# Patient Record
Sex: Female | Born: 1950 | Race: Black or African American | Hispanic: No | Smoking: Never smoker
Health system: Southern US, Community
[De-identification: ages and names within clinical notes are randomized; demographics above are authoritative.]

## PROBLEM LIST (undated history)

## (undated) DIAGNOSIS — E785 Hyperlipidemia, unspecified: Secondary | ICD-10-CM

## (undated) DIAGNOSIS — E119 Type 2 diabetes mellitus without complications: Secondary | ICD-10-CM

## (undated) DIAGNOSIS — I10 Essential (primary) hypertension: Secondary | ICD-10-CM

## (undated) HISTORY — PX: TONSILLECTOMY: SUR1361

## (undated) HISTORY — PX: TUBAL LIGATION: SHX77

---

## 2014-07-15 ENCOUNTER — Other Ambulatory Visit: Payer: Self-pay | Admitting: Occupational Medicine

## 2014-07-15 ENCOUNTER — Ambulatory Visit: Payer: Self-pay

## 2014-07-15 DIAGNOSIS — M25522 Pain in left elbow: Secondary | ICD-10-CM

## 2015-05-26 ENCOUNTER — Telehealth (HOSPITAL_COMMUNITY): Payer: Self-pay | Admitting: *Deleted

## 2015-05-26 NOTE — Telephone Encounter (Signed)
Telephoned patient at home # and no answer. 

## 2015-08-10 ENCOUNTER — Telehealth (HOSPITAL_COMMUNITY): Payer: Self-pay | Admitting: *Deleted

## 2015-08-10 NOTE — Telephone Encounter (Signed)
Telephoned patient at home # and no answer. 

## 2015-08-17 ENCOUNTER — Telehealth (HOSPITAL_COMMUNITY): Payer: Self-pay | Admitting: *Deleted

## 2015-08-17 NOTE — Telephone Encounter (Signed)
Telephoned patient at home # and no answer. 

## 2015-11-25 ENCOUNTER — Other Ambulatory Visit: Payer: Self-pay | Admitting: Obstetrics and Gynecology

## 2015-11-25 DIAGNOSIS — Z1231 Encounter for screening mammogram for malignant neoplasm of breast: Secondary | ICD-10-CM

## 2015-12-10 ENCOUNTER — Ambulatory Visit (HOSPITAL_COMMUNITY): Payer: Self-pay

## 2016-05-31 ENCOUNTER — Other Ambulatory Visit: Payer: Self-pay | Admitting: Obstetrics and Gynecology

## 2016-05-31 DIAGNOSIS — Z1231 Encounter for screening mammogram for malignant neoplasm of breast: Secondary | ICD-10-CM

## 2016-06-21 ENCOUNTER — Ambulatory Visit (HOSPITAL_COMMUNITY)
Admission: RE | Admit: 2016-06-21 | Discharge: 2016-06-21 | Disposition: A | Discharge status: Home / Self Care | Payer: Self-pay | Source: Ambulatory Visit | Attending: Obstetrics and Gynecology | Admitting: Obstetrics and Gynecology

## 2016-06-21 ENCOUNTER — Encounter (HOSPITAL_COMMUNITY): Payer: Self-pay

## 2016-06-21 ENCOUNTER — Ambulatory Visit
Admission: RE | Admit: 2016-06-21 | Discharge: 2016-06-21 | Disposition: A | Discharge status: Home / Self Care | Payer: No Typology Code available for payment source | Source: Ambulatory Visit | Attending: Obstetrics and Gynecology | Admitting: Obstetrics and Gynecology

## 2016-06-21 VITALS — BP 140/80 | Ht 60.5 in | Wt 181.6 lb

## 2016-06-21 DIAGNOSIS — Z1239 Encounter for other screening for malignant neoplasm of breast: Secondary | ICD-10-CM

## 2016-06-21 DIAGNOSIS — Z1231 Encounter for screening mammogram for malignant neoplasm of breast: Secondary | ICD-10-CM

## 2016-06-21 HISTORY — DX: Type 2 diabetes mellitus without complications: E11.9

## 2016-06-21 HISTORY — DX: Essential (primary) hypertension: I10

## 2016-06-21 HISTORY — DX: Hyperlipidemia, unspecified: E78.5

## 2016-06-21 NOTE — Patient Instructions (Signed)
Explained breast self awareness with [name]. Patient did not need a Pap smear today due to last Pap smear was in 2017 per patient. Let her know B[facility] will cover Pap smears every 3 years unless has a history of abnormal Pap smears. [facility] for [facility]. Appointment scheduled for Tuesday, Jun 21, 2016 at 1610. Let patient know the Breast Center will follow up with her within the next couple weeks with results of mammogram by letter or phone. Barbara Fields verbalized understanding.  Flavius Repsher, [name], RN 4:01 PM

## 2016-06-21 NOTE — Progress Notes (Signed)
No complaints today.   Pap Smear: Pap smear not completed today. Last Pap smear was in 2017 at the free cervical cancer screening at [facility] and normal per patient. Per patient has a history of an abnormal Pap smear 10 years ago that a colposcopy was completed for follow up. Patient stated she has had at least three normal Pap smears since colposcopy. No Pap smear results are in EPIC.  Physical exam: Breasts Right breast slightly larger than left breast that per patient is normal for her. No skin abnormalities bilateral breasts. No nipple retraction bilateral breasts. No nipple discharge bilateral breasts. No lymphadenopathy. No lumps palpated bilateral breasts. No complaints of pain or tenderness on exam. [facility] for a screening mammogram. Appointment scheduled for Tuesday, Jun 21, 2016 at 1610.        Pelvic/Bimanual No Pap smear completed today since last Pap smear was in 2017 per patient. Pap smear not indicated per BCCCP guidelines.   Smoking History: Patient has never smoked.  Patient Navigation: Patient education provided. Access to services provided for patient through BCCCP program.   Colorectal Cancer Screening: Per patient had a colonoscopy completed 20 years ago. No complaints today. FIT Test given to patient to complete and return to B[facility]CP.

## 2016-06-28 ENCOUNTER — Encounter (HOSPITAL_COMMUNITY): Payer: Self-pay | Admitting: *Deleted

## 2016-07-08 ENCOUNTER — Other Ambulatory Visit: Payer: Self-pay

## 2016-07-08 LAB — FECAL OCCULT BLOOD, GUAIAC: Fecal Occult Blood: NEGATIVE

## 2016-07-11 LAB — FECAL OCCULT BLOOD, IMMUNOCHEMICAL: Fecal Occult Bld: NEGATIVE

## 2016-07-20 ENCOUNTER — Encounter (HOSPITAL_COMMUNITY): Payer: Self-pay | Admitting: *Deleted

## 2016-07-20 NOTE — Progress Notes (Signed)
Letter mailed to patient with negative Fit Test results.  

## 2017-01-31 HISTORY — PX: BREAST BIOPSY: SHX20

## 2017-08-07 ENCOUNTER — Other Ambulatory Visit: Payer: Self-pay | Admitting: Obstetrics and Gynecology

## 2017-08-07 DIAGNOSIS — Z1231 Encounter for screening mammogram for malignant neoplasm of breast: Secondary | ICD-10-CM

## 2017-08-29 ENCOUNTER — Ambulatory Visit
Admission: RE | Admit: 2017-08-29 | Discharge: 2017-08-29 | Disposition: A | Discharge status: Home / Self Care | Payer: No Typology Code available for payment source | Source: Ambulatory Visit | Attending: Obstetrics and Gynecology | Admitting: Obstetrics and Gynecology

## 2017-08-29 ENCOUNTER — Encounter (HOSPITAL_COMMUNITY): Payer: Self-pay

## 2017-08-29 ENCOUNTER — Other Ambulatory Visit (HOSPITAL_COMMUNITY): Payer: Self-pay | Admitting: *Deleted

## 2017-08-29 ENCOUNTER — Ambulatory Visit (HOSPITAL_COMMUNITY)
Admission: RE | Admit: 2017-08-29 | Discharge: 2017-08-29 | Disposition: A | Discharge status: Home / Self Care | Payer: Self-pay | Source: Ambulatory Visit | Attending: Obstetrics and Gynecology | Admitting: Obstetrics and Gynecology

## 2017-08-29 VITALS — BP 132/84 | Ht <= 58 in

## 2017-08-29 DIAGNOSIS — Z1231 Encounter for screening mammogram for malignant neoplasm of breast: Secondary | ICD-10-CM

## 2017-08-29 DIAGNOSIS — Z1239 Encounter for other screening for malignant neoplasm of breast: Secondary | ICD-10-CM

## 2017-08-29 DIAGNOSIS — R2232 Localized swelling, mass and lump, left upper limb: Secondary | ICD-10-CM

## 2017-08-29 DIAGNOSIS — R2231 Localized swelling, mass and lump, right upper limb: Secondary | ICD-10-CM

## 2017-08-29 NOTE — Progress Notes (Signed)
Complaints of left axillary lump x 3 days.  Pap Smear: Pap smear not completed today. Last Pap smear was in 2017 at the free cervical [facility] screening [facility] and normal per patient. Per patient has a history of an abnormal Pap smear 11 years ago that a colposcopy was completed for follow up. Patient stated she has had at least three normal Pap smears since colposcopy. No Pap smear results are in EPIC.  Physical exam: Breasts Breasts symmetrical. No skin abnormalities bilateral breasts. No nipple retraction bilateral breasts. No nipple discharge bilateral breasts. No lymphadenopathy. No lumps palpated left breast. Palpated a lump within the border of the right axilla at 11 o'clock 25 cm from the nipple. No complaints of pain or tenderness on exam. [facility] for a diagnostic mammogram and right breast ultrasound. Appointment scheduled for Tuesday, September 05, 2017 at 1040.        Pelvic/Bimanual No Pap smear completed today since last Pap smear was in 2017 per patient. Pap smear not indicated per BCCCP guidelines.   Smoking History: Patient has never smoked.  Patient Navigation: Patient education provided. Access to services provided for patient through BCCCP program.   Colorectal Cancer Screening: Per patient had a colonoscopy completed around 20 years ago. FIT Test completed 07/08/2016 that was negative. No complaints today. FIT Test given to patient to complete and return to BCCCP.  Breast and Cervical Cancer Risk Assessment: Patient has no family history of breast cancer, known genetic mutations, or radiation treatment to the chest before age 65. Per patient has a history of cervical dysplasia. Patient has no history of being immunocompromised or DES exposure in-utero.  Risk Assessment    Risk Scores      08/29/2017   Last edited by: Lynnell DikeHolland, Sabrina H, LPN   5-year risk: 1.6 %   Lifetime risk: 6.2 %

## 2017-08-29 NOTE — Patient Instructions (Signed)
Explained breast self awareness with Barbara Fields. Patient did not need a Pap smear today due to last Pap smear was in 2017 per patient. Let her know BCCCP will cover Pap smears every 3 years unless has a history of abnormal Pap smears. [facility] for [facility] diagnostic mamm[facility] and right breast ultrasound. Appointment scheduled for Tuesday, September 05, 2017 at 1040. Patient aware of appointment and will be there. Barbara Fields verbalized understanding.  Barbara Fields, Barbara Maserhristine Poll, RN 4:31 PM

## 2017-08-30 ENCOUNTER — Other Ambulatory Visit: Payer: Self-pay | Admitting: Obstetrics and Gynecology

## 2017-09-01 ENCOUNTER — Encounter (HOSPITAL_COMMUNITY): Payer: Self-pay | Admitting: *Deleted

## 2017-09-05 ENCOUNTER — Other Ambulatory Visit (HOSPITAL_COMMUNITY): Payer: Self-pay | Admitting: Obstetrics and Gynecology

## 2017-09-05 ENCOUNTER — Ambulatory Visit
Admission: RE | Admit: 2017-09-05 | Discharge: 2017-09-05 | Disposition: A | Discharge status: Home / Self Care | Payer: No Typology Code available for payment source | Source: Ambulatory Visit | Attending: Obstetrics and Gynecology | Admitting: Obstetrics and Gynecology

## 2017-09-05 DIAGNOSIS — N6310 Unspecified lump in the right breast, unspecified quadrant: Secondary | ICD-10-CM

## 2017-09-05 DIAGNOSIS — R2231 Localized swelling, mass and lump, right upper limb: Secondary | ICD-10-CM

## 2017-09-06 ENCOUNTER — Ambulatory Visit
Admission: RE | Admit: 2017-09-06 | Discharge: 2017-09-06 | Disposition: A | Discharge status: Home / Self Care | Payer: No Typology Code available for payment source | Source: Ambulatory Visit | Attending: Obstetrics and Gynecology | Admitting: Obstetrics and Gynecology

## 2017-09-06 DIAGNOSIS — N6310 Unspecified lump in the right breast, unspecified quadrant: Secondary | ICD-10-CM

## 2017-09-07 LAB — FECAL OCCULT BLOOD, IMMUNOCHEMICAL: Fecal Occult Bld: NEGATIVE

## 2017-09-18 ENCOUNTER — Encounter (HOSPITAL_COMMUNITY): Payer: Self-pay

## 2017-10-03 ENCOUNTER — Ambulatory Visit
Admission: RE | Admit: 2017-10-03 | Discharge: 2017-10-03 | Disposition: A | Discharge status: Home / Self Care | Payer: No Typology Code available for payment source | Source: Ambulatory Visit | Attending: Internal Medicine | Admitting: Internal Medicine

## 2017-10-03 ENCOUNTER — Other Ambulatory Visit: Payer: Self-pay | Admitting: Internal Medicine

## 2017-10-03 DIAGNOSIS — Z111 Encounter for screening for respiratory tuberculosis: Secondary | ICD-10-CM

## 2017-10-09 ENCOUNTER — Other Ambulatory Visit (HOSPITAL_COMMUNITY): Payer: Self-pay | Admitting: *Deleted

## 2017-10-09 DIAGNOSIS — Z0000 Encounter for general adult medical examination without abnormal findings: Secondary | ICD-10-CM

## 2017-10-11 ENCOUNTER — Inpatient Hospital Stay: Payer: Self-pay

## 2017-10-11 ENCOUNTER — Ambulatory Visit: Payer: Self-pay

## 2017-10-11 ENCOUNTER — Inpatient Hospital Stay: Payer: Self-pay | Attending: Obstetrics and Gynecology | Admitting: *Deleted

## 2017-10-11 VITALS — BP 118/70 | Ht 59.0 in | Wt 182.0 lb

## 2017-10-11 DIAGNOSIS — Z0000 Encounter for general adult medical examination without abnormal findings: Secondary | ICD-10-CM | POA: Insufficient documentation

## 2017-10-11 LAB — LIPID PANEL
Cholesterol: 110 mg/dL (ref 0–200)
HDL: 28 mg/dL — ABNORMAL LOW (ref >40)
LDL Cholesterol: 60 mg/dL (ref 0–99)
Total CHOL/HDL Ratio: 3.9 RATIO
Triglycerides: 111 mg/dL (ref <150)
VLDL: 22 mg/dL (ref 0–40)

## 2017-10-11 LAB — HEMOGLOBIN A1C
Hgb A1c MFr Bld: 8.4 % — ABNORMAL HIGH (ref 4.8–5.6)
Mean Plasma Glucose: 194.38 mg/dL

## 2017-10-11 NOTE — Progress Notes (Signed)
[facility] initial screening  Clinical Measurement:  Height: 59in Weight:  182lb Blood Pressure: 120/72  Blood Pressure #2: 118/70   Fasting Labs Drawn Today, will review with patient when they result.  Medical History:  Patient states that she has  been diagnosed with high cholesterol, high blood pressure, diabetes but not  heart disease.  Medications:  Patients states she is taking medications for high cholesterol, high blood pressure and diabetes.  She is  taking aspirin daily to prevent heart attack or stroke.    Blood pressure, self measurement:  Patients states she does not measure blood pressure at home.    Nutrition:  Patient states she eats 1 cup of fruit and 1 cup of vegetables in an average day.  Patient states she does not eat fish regularly, she eats less than half a serving of whole grains daily. She drinks less than 36 ounces of beverages with added sugar weekly.  She is currently not watching her sodium intake.  She has  had 2 drinks containing alcohol in the last seven days.    Physical activity:  Patient states that she gets 90 minutes of moderate exercise in a week.  She gets 0 minutes of vigorous exercise per week.   Smoking status:  Patient states she has never smoked and is not around any smokers.     Quality of life:  Patient states that she has had 0 bad physical days out of the last 30 days. In the last 2 weeks, she has had several days that she has felt down or depressed. She has had a few  days in the last 2 weeks that she has had little interest or pleasure in doing things.  Risk reduction and counseling:  Patient states she wants to lose weight and increase fruit and vegetable intake.  I encouraged her to continue with current exercise regimen and increase vegetable and fruit intake.  Navigation:  I will notify patient of lab results.  Patient is aware of 2 more health coaching sessions and a follow up.

## 2017-10-11 NOTE — Addendum Note (Signed)
Addended by: Deri Fuelling R on: 10/11/2017 09:03 AM   Modules accepted: Orders

## 2017-10-13 ENCOUNTER — Telehealth (HOSPITAL_COMMUNITY): Payer: Self-pay | Admitting: *Deleted

## 2017-10-13 NOTE — Telephone Encounter (Incomplete)
  Health coaching 2  Labs- HDL cholesterol 28, cholesterol  110, triglycerides 111,  LDL cholesterol 60,  Hemoglobin A1C 8.4, mean plasma glucose 194.38   Patient is aware and understands these lab results.   Goals- Patient states she wants to lose weight. I encouraged patient to walk 150 minutes weekly and to chose lean meats,fish (salmon, mackerel and tuna) and to use olive oil and canola oil to cook with. I encouraged patient to snack on nuts such as almonds and walnuts.  Navigation:  Patient is aware of 1 more health coaching sessions and a follow up.     Time- 10 minutes

## 2017-10-13 NOTE — Telephone Encounter (Signed)
Addendum  Patient is under the care of the [facility], at [address],[place], [place] [id number]. She has an appointment in October.

## 2017-11-13 ENCOUNTER — Other Ambulatory Visit: Payer: Self-pay

## 2017-11-17 LAB — CYTOLOGY - PAP: Diagnosis: NEGATIVE

## 2017-12-26 ENCOUNTER — Ambulatory Visit: Payer: Medicare HMO | Attending: Family Medicine | Admitting: Family Medicine

## 2017-12-26 ENCOUNTER — Encounter: Payer: Self-pay | Admitting: Family Medicine

## 2017-12-26 VITALS — BP 138/84 | HR 75 | Resp 16 | Ht 61.0 in | Wt 178.8 lb

## 2017-12-26 DIAGNOSIS — E785 Hyperlipidemia, unspecified: Secondary | ICD-10-CM

## 2017-12-26 DIAGNOSIS — I10 Essential (primary) hypertension: Secondary | ICD-10-CM

## 2017-12-26 DIAGNOSIS — Z1211 Encounter for screening for malignant neoplasm of colon: Secondary | ICD-10-CM

## 2017-12-26 DIAGNOSIS — E1140 Type 2 diabetes mellitus with diabetic neuropathy, unspecified: Secondary | ICD-10-CM

## 2017-12-26 DIAGNOSIS — F419 Anxiety disorder, unspecified: Secondary | ICD-10-CM

## 2017-12-26 DIAGNOSIS — Z79899 Other long term (current) drug therapy: Secondary | ICD-10-CM

## 2017-12-26 DIAGNOSIS — F5104 Psychophysiologic insomnia: Secondary | ICD-10-CM | POA: Diagnosis not present

## 2017-12-26 MED ORDER — LISINOPRIL-HYDROCHLOROTHIAZIDE 20-12.5 MG PO TABS: 1.0000 | ORAL_TABLET | Freq: Every day | ORAL | 4 refills | Status: DC

## 2017-12-26 MED ORDER — METFORMIN HCL ER (OSM) 1000 MG PO TB24: 1000.0000 mg | ORAL_TABLET | Freq: Two times a day (BID) | ORAL | 4 refills | Status: DC

## 2017-12-26 MED ORDER — SIMVASTATIN 40 MG PO TABS: 40.0000 mg | ORAL_TABLET | Freq: Every day | ORAL | 4 refills | Status: AC

## 2017-12-26 MED ORDER — ZOLPIDEM TARTRATE 5 MG PO TABS: 5.0000 mg | ORAL_TABLET | Freq: Every evening | ORAL | 3 refills | Status: AC | PRN

## 2017-12-26 NOTE — Progress Notes (Signed)
Subjective:    Patient ID: Barbara Fields, female    DOB: [birth year], 67 y.o.   MRN: [id number]  HPI       67 year old female new to the practice, patient with past medical history significant for diabetes, hypertension and dyslipidemia.  On review of chart, patient has had blood work done 10/11/2017 with hemoglobin A1c of 8.4, lipid panel with total cholesterol of 110, HDL low at 28 and LDL of 60.      Patient reports that she presents to establish care as she now has Medicare.  Patient reports past medical history is significant for hypertension, diabetes and hyperlipidemia.  Patient believes that her blood sugars are well controlled but she does not check her blood sugars on a frequent basis.  Patient states that she has had some recent home blood sugars that are in the 160s and 140s but prior to that, most of her blood sugars have stayed in the 200s.  Patient reports blood sugar of 149 this morning.  Patient states that she has had A1c and other blood work done within the past few months.  Patient does have occasional issues with increased thirst and urinary frequency.  Patient reports that she had her diabetic eye exam done about 2 weeks ago when she did not have any diabetic retinopathy but did have some changes associated with her hypertension.  Patient reports that she has taken her lisinopril- HCTZ daily.  Patient denies any cough or muscle cramping associated with the use of this medication.  Patient has her medication bottles with her and she is currently on simvastatin 40 mg daily.  Patient has had no increase muscle aches with use of this medication.  Patient admits that she does not always stick to a diabetic or low-sodium diet.      Patient also had pill bottles for zolpidem and Trintellix as well as clonazepam.  Patient states that she would like a refill prescription for zolpidem and she takes anywhere from 1 to 1/2 pills per night to help with sleep.  Patient states that she has  long-standing issues with anxiety and depression and states that she has been getting these medications filled from another provider at [facility] however she does not think that this provider is listed with any of the Medicare programs.  Patient reports that she has to pay $100 every 42-monthto obtain refills of her medication.  Patient does not believe that the Trintellix has made much of a difference in her anxiety or depression.  Patient states that the clonazepam is the only thing that makes her feel calm and feel normal.      Patient reports allergy to vancomycin which caused her to have hives after she received IV vancomycin for treatment of a MRSA infection in the past.  Patient reports a family history of her mother with diabetes and had bilateral lower extremity amputations.  Patient also believes that her mother had a mild stroke.  Patient states that her mother had 13 siblings and many of her mother's siblings have hypertension and diabetes.  Patient has a brother with diabetes.  Patient reports that her father died from heart attack.  Patient has a daughter who died at the age of 341from liver cancer.  Patient reports a past surgical history of a tubal ligation, tonsillectomy around the age of 89 left eye surgery for placement of a metal plate due to a fracture of the orbit secondary to trauma.  Patient also reports  a prior facelift.  Patient has also had a biopsy of the right breast that was benign.  Patient initially stated that she was a non-smoker but upon further questioning, patient quit smoking about 20 years ago but prior to that point had smoked about 1 pack/day of cigarettes since the age of 66.  Patient reports that she has had recent Pap smear and mammogram is up-to-date.  Patient reports that she has not had a colonoscopy in about 10 or more years.  Patient agrees to referral for screening colonoscopy.  Past Medical History:  Diagnosis Date  . Diabetes mellitus without  complication ([place])   . Hyperlipemia   . Hypertension    Past Surgical History:  Procedure Laterality Date  . TONSILLECTOMY    . TUBAL LIGATION     Family History  Problem Relation Age of Onset  . Diabetes Mother   . Hypertension Mother   . Diabetes Sister   . Diabetes Brother   . Breast cancer Cousin    Social History   Tobacco Use  . Smoking status: Never Smoker  . Smokeless tobacco: Never Used  Substance Use Topics  . Alcohol use: Yes    Comment: rarely  . Drug use: No   Allergies  Allergen Reactions  . Peanut Oil Rash  . Shellfish Allergy Hives  . Vancomycin Hives   Current Outpatient Medications on File Prior to Visit  Medication Sig Dispense Refill  . APPLE CIDER VINEGAR PO Take 480 mg by mouth daily.    Marland Kitchen aspirin 81 MG chewable tablet Chew 1 tablet by mouth daily.    . Blood Glucose Monitoring Suppl (ACCU-CHEK AVIVA PLUS) w/Device KIT 1 kit by Other route as directed.    Marland Kitchen CINNAMON PO Take 1,000 mg by mouth daily.    . clonazePAM (KLONOPIN) 0.5 MG tablet Take 1 tablet by mouth as needed.    . fluticasone (FLONASE) 50 MCG/ACT nasal spray Place 1 spray into the nose as needed.    . Multiple Vitamins-Minerals (ONE-A-DAY 50 PLUS PO) Take 1 tablet by mouth daily.    . TRINTELLIX 10 MG TABS tablet Take 1 tablet by mouth daily.    . vitamin B-12 (CYANOCOBALAMIN) 50 MCG tablet Take 1 tablet by mouth as needed.     No current facility-administered medications on file prior to visit.       Review of Systems  Constitutional: Positive for fatigue. Negative for diaphoresis and fever.  HENT: Negative for sore throat and trouble swallowing.   Eyes: Negative for photophobia and visual disturbance.  Respiratory: Negative for cough and shortness of breath.   Cardiovascular: Negative for chest pain, palpitations and leg swelling.  Gastrointestinal: Negative for abdominal pain, constipation, diarrhea and nausea.  Endocrine: Positive for polydipsia, polyphagia and polyuria.         Sometimes has increased urination, increased hunger and sensation of dry mouth when blood sugars are elevated  Genitourinary: Negative for dysuria and flank pain.       Occasional urinary frequency  Musculoskeletal: Negative for back pain and gait problem.  Neurological: Positive for numbness (Has occasional numbness in her hands and feet). Negative for dizziness and headaches.  Hematological: Negative for adenopathy. Does not bruise/bleed easily.  Psychiatric/Behavioral: Positive for sleep disturbance. Negative for self-injury and suicidal ideas. The patient is nervous/anxious.        Objective:   Physical Exam BP 138/84 (BP Location: Left Arm, Patient Position: Sitting, Cuff Size: Normal)   Pulse 75   Resp 16  Ht _0  (1.549 m)   Wt 178 lb 12.8 oz (81.1 kg)   BMI 33.78 kg/m Nurse's notes and vital signs reviewed General-well-nourished, well-developed overweight for height older female in no acute distress ENT-TMs gray and slightly dull, patient with moderate edema of the nasal turbinates with mild clear discharge, patient with mild posterior pharynx/tonsillar arch erythema Neck-supple, no lymphadenopathy, no thyromegaly, no carotid bruit.  Patient does have some mild, nontender, submandibular lymphadenopathy Cardiovascular-regular rate and rhythm Lungs-clear to auscultation bilaterally Abdomen-truncal obesity, soft and nontender Back-no CVA tenderness Extremities-no edema Diabetic foot exam- patient with negative sensation on the left heel on monofilament exam and patient with thickened skin/callus on the left heel.  Patient also with a callus on the right medial great toe with absence of sensation over the area of the callus but otherwise normal monofilament exam on the right.  Patient's peripheral pulses, dorsalis pedis and posterior tibial are nonpalpable.  Patient's feet are slightly cool bilaterally but no evidence of cyanosis.  Patient with adequate capillary refill  time. Psychiatric- normal mood and judgment, patient became slightly agitated/verbally aggressive when she was told that her Ambien dose will be decreased to 5 mg due to her age with decreased amount per month as well as the need for her clonazepam to be filled by her prior provider and/or referral to a psychiatric specialist.  Patient stated that she may change to a different provider or return to her prior provider        Assessment & Plan:  1. Type 2 diabetes mellitus with diabetic neuropathy, without long-term current use of insulin ([place]) Review of chart, patient's last hemoglobin A1c was 8.4 on 10/11/2017.  I discussed with the patient that this is considered to be high and her goal A1c is 7.0 or less.  Patient was encouraged to monitor her blood sugars more frequently, make dietary changes and continue adherence to use of metformin.  Patient will have CMP and urine, microalbumin at today's visit as well as vitamin B12 level as metformin can decrease vitamin B12 and patient with complaint of some numbness and tingling in her hands and feet and abnormal monofilament exam.  Patient reports that she has had diabetic eye exam approximately 2 weeks ago. - Comprehensive metabolic panel - Microalbumin/Creatinine Ratio, Urine - Vitamin B12  2. Essential hypertension Patient had pill bottle with her for lisinopril-HCTZ at 20-12.5 mg which was refilled at today's visit.  Patient's blood pressure is adequately controlled.  Blood pressure goal of 130/80 discussed as well as low-salt diet.  Patient will also have microalbumin/creatinine ratio at today's visit. - Microalbumin/Creatinine Ratio, Urine  3. Dyslipidemia Patient with a history of dyslipidemia and patient did have lipid panel done on 10/11/2017 with total cholesterol at 110, triglycerides of 111, HDL of 28 and LDL of 60.  Patient will continue use of simvastatin 40 mg but this will likely be lowered if her next lipid panel continues to remain  within normal limits.  Patient will have CMP in follow-up of medication use. - Comprehensive metabolic panel  4. Chronic insomnia Patient with complaint of anxiety/depression and chronic insomnia.  Patient was not pleased that her dose of Ambien would be decreased from her current 10 mg down to 5 mg.  Discussed with patient that due to her age, 5 mg was maximum dose and that Ambien is not recommended in geriatric populations due to increased fall risk, mental impairment and possible increased risk for future development of dementia.  Patient was provided  with prescription for Ambien 5 mg #15.  Patient is being referred to psychiatry for follow-up of her issues with anxiety/depression and chronic insomnia. - Ambulatory referral to Psychiatry - zolpidem (AMBIEN) 5 MG tablet; Take 1 tablet (5 mg total) by mouth at bedtime as needed for sleep.  Dispense: 15 tablet; Refill: 3  5. Anxiety Patient has current bottles for Trintellix and clonazepam which she states that she received from [facility].  Patient may continue follow-up with [facility] though she complains of the additional cost of $100 every 3 months but will also be referred to psychiatry for further evaluation and treatment as patient states that the Trintellix does not really seem to work. - Ambulatory referral to Psychiatry  6. Encounter for long-term (current) use of medications Patient will have CMP and urine microalbumin in follow-up of long-term use of medications. - Comprehensive metabolic panel - Microalbumin/Creatinine Ratio, Urine - Vitamin B12  7.  Screening for colon cancer Patient agrees to referral for screening colonoscopy  Return in about 4 months (around 04/26/2018) for Diabetes.   An After Visit Summary was printed and given to the patient.

## 2017-12-26 NOTE — Patient Instructions (Signed)
Blood Glucose Monitoring, Adult Monitoring your blood sugar (glucose) helps you manage your diabetes. It also helps you and your health care provider determine how well your diabetes management plan is working. Blood glucose monitoring involves checking your blood glucose as often as directed, and keeping a record (log) of your results over time. Why should I monitor my blood glucose? Checking your blood glucose regularly can:  Help you understand how food, exercise, illnesses, and medicines affect your blood glucose.  Let you know what your blood glucose is at any time. You can quickly tell if you are having low blood glucose (hypoglycemia) or high blood glucose (hyperglycemia).  Help you and your health care provider adjust your medicines as needed.  When should I check my blood glucose? Follow instructions from your health care provider about how often to check your blood glucose. This may depend on:  The type of diabetes you have.  How well-controlled your diabetes is.  Medicines you are taking.  If you have type 1 diabetes:  Check your blood glucose at least 2 times a day.  Also check your blood glucose: ? Before every insulin injection. ? Before and after exercise. ? Between meals. ? 2 hours after a meal. ? Occasionally between 2:00 a.m. and 3:00 a.m., as directed. ? Before potentially dangerous tasks, like driving or using heavy machinery. ? At bedtime.  You may need to check your blood glucose more often, up to 6-10 times a day: ? If you use an insulin pump. ? If you need multiple daily injections (MDI). ? If your diabetes is not well-controlled. ? If you are ill. ? If you have a history of severe hypoglycemia. ? If you have a history of not knowing when your blood glucose is getting low (hypoglycemia unawareness). If you have type 2 diabetes:  If you take insulin or other diabetes medicines, check your blood glucose at least 2 times a day.  If you are on intensive  insulin therapy, check your blood glucose at least 4 times a day. Occasionally, you may also need to check between 2:00 a.m. and 3:00 a.m., as directed.  Also check your blood glucose: ? Before and after exercise. ? Before potentially dangerous tasks, like driving or using heavy machinery.  You may need to check your blood glucose more often if: ? Your medicine is being adjusted. ? Your diabetes is not well-controlled. ? You are ill. What is a blood glucose log?  A blood glucose log is a record of your blood glucose readings. It helps you and your health care provider: ? Look for patterns in your blood glucose over time. ? Adjust your diabetes management plan as needed.  Every time you check your blood glucose, write down your result and notes about things that may be affecting your blood glucose, such as your diet and exercise for the day.  Most glucose meters store a record of glucose readings in the meter. Some meters allow you to download your records to a computer. How do I check my blood glucose? Follow these steps to get accurate readings of your blood glucose: Supplies needed   Blood glucose meter.  Test strips for your meter. Each meter has its own strips. You must use the strips that come with your meter.  A needle to prick your finger (lancet). Do not use lancets more than once.  A device that holds the lancet (lancing device).  A journal or log book to write down your results. Procedure  Wash your hands with soap and water.  Prick the side of your finger (not the tip) with the lancet. Use a different finger each time.  Gently rub the finger until a small drop of blood appears.  Follow instructions that come with your meter for inserting the test strip, applying blood to the strip, and using your blood glucose meter.  Write down your result and any notes. Alternative testing sites  Some meters allow you to use areas of your body other than your finger  (alternative sites) to test your blood.  If you think you may have hypoglycemia, or if you have hypoglycemia unawareness, do not use alternative sites. Use your finger instead.  Alternative sites may not be as accurate as the fingers, because blood flow is slower in these areas. This means that the result you get may be delayed, and it may be different from the result that you would get from your finger.  The most common alternative sites are: ? Forearm. ? Thigh. ? Palm of the hand. Additional tips  Always keep your supplies with you.  If you have questions or need help, all blood glucose meters have a 24-hour "hotline" number that you can call. You may also contact your health care provider.  After you use a few boxes of test strips, adjust (calibrate) your blood glucose meter by following instructions that came with your meter. This information is not intended to replace advice given to you by your health care provider. Make sure you discuss any questions you have with your health care provider. Document Released: 01/20/2003 Document Revised: 08/07/2015 Document Reviewed: 06/29/2015 Elsevier Interactive Patient Education  2017 Elsevier Inc. Insomnia Insomnia is a sleep disorder that makes it difficult to fall asleep or to stay asleep. Insomnia can cause tiredness (fatigue), low energy, difficulty concentrating, mood swings, and poor performance at work or school. There are three different ways to classify insomnia:  Difficulty falling asleep.  Difficulty staying asleep.  Waking up too early in the morning.  Any type of insomnia can be long-term (chronic) or short-term (acute). Both are common. Short-term insomnia usually lasts for three months or less. Chronic insomnia occurs at least three times a week for longer than three months. What are the causes? Insomnia may be caused by another condition, situation, or substance, such as:  Anxiety.  Certain  medicines.  Gastroesophageal reflux disease (GERD) or other gastrointestinal conditions.  Asthma or other breathing conditions.  Restless legs syndrome, sleep apnea, or other sleep disorders.  Chronic pain.  Menopause. This may include hot flashes.  Stroke.  Abuse of alcohol, tobacco, or illegal drugs.  Depression.  Caffeine.  Neurological disorders, such as Alzheimer disease.  An overactive thyroid (hyperthyroidism).  The cause of insomnia may not be known. What increases the risk? Risk factors for insomnia include:  Gender. Women are more commonly affected than men.  Age. Insomnia is more common as you get older.  Stress. This may involve your professional or personal life.  Income. Insomnia is more common in people with lower income.  Lack of exercise.  Irregular work schedule or night shifts.  Traveling between different time zones.  What are the signs or symptoms? If you have insomnia, trouble falling asleep or trouble staying asleep is the main symptom. This may lead to other symptoms, such as:  Feeling fatigued.  Feeling nervous about going to sleep.  Not feeling rested in the morning.  Having trouble concentrating.  Feeling irritable, anxious, or depressed.  How  is this treated? Treatment for insomnia depends on the cause. If your insomnia is caused by an underlying condition, treatment will focus on addressing the condition. Treatment may also include:  Medicines to help you sleep.  Counseling or therapy.  Lifestyle adjustments.  Follow these instructions at home:  Take medicines only as directed by your health care provider.  Keep regular sleeping and waking hours. Avoid naps.  Keep a sleep diary to help you and your health care provider figure out what could be causing your insomnia. Include: ? When you sleep. ? When you wake up during the night. ? How well you sleep. ? How rested you feel the next day. ? Any side effects of  medicines you are taking. ? What you eat and drink.  Make your bedroom a comfortable place where it is easy to fall asleep: ? Put up shades or special blackout curtains to block light from outside. ? Use a white noise machine to block noise. ? Keep the temperature cool.  Exercise regularly as directed by your health care provider. Avoid exercising right before bedtime.  Use relaxation techniques to manage stress. Ask your health care provider to suggest some techniques that may work well for you. These may include: ? Breathing exercises. ? Routines to release muscle tension. ? Visualizing peaceful scenes.  Cut back on alcohol, caffeinated beverages, and cigarettes, especially close to bedtime. These can disrupt your sleep.  Do not overeat or eat spicy foods right before bedtime. This can lead to digestive discomfort that can make it hard for you to sleep.  Limit screen use before bedtime. This includes: ? Watching TV. ? Using your smartphone, tablet, and computer.  Stick to a routine. This can help you fall asleep faster. Try to do a quiet activity, brush your teeth, and go to bed at the same time each night.  Get out of bed if you are still awake after 15 minutes of trying to sleep. Keep the lights down, but try reading or doing a quiet activity. When you feel sleepy, go back to bed.  Make sure that you drive carefully. Avoid driving if you feel very sleepy.  Keep all follow-up appointments as directed by your health care provider. This is important. Contact a health care provider if:  You are tired throughout the day or have trouble in your daily routine due to sleepiness.  You continue to have sleep problems or your sleep problems get worse. Get help right away if:  You have serious thoughts about hurting yourself or someone else. This information is not intended to replace advice given to you by your health care provider. Make sure you discuss any questions you have with  your health care provider. Document Released: 01/15/2000 Document Revised: 06/19/2015 Document Reviewed: 10/18/2013 Elsevier Interactive Patient Education  [employer]2018 Elsevier Inc.

## 2017-12-27 LAB — COMPREHENSIVE METABOLIC PANEL WITH GFR
ALT: 55 IU/L — ABNORMAL HIGH (ref 0–32)
AST: 34 IU/L (ref 0–40)
Albumin/Globulin Ratio: 1.7 (ref 1.2–2.2)
Albumin: 4.9 g/dL — ABNORMAL HIGH (ref 3.6–4.8)
Alkaline Phosphatase: 79 IU/L (ref 39–117)
BUN/Creatinine Ratio: 13 (ref 12–28)
BUN: 13 mg/dL (ref 8–27)
Bilirubin Total: 0.3 mg/dL (ref 0.0–1.2)
CO2: 22 mmol/L (ref 20–29)
Calcium: 10.7 mg/dL — ABNORMAL HIGH (ref 8.7–10.3)
Chloride: 99 mmol/L (ref 96–106)
Creatinine, Ser: 1.03 mg/dL — ABNORMAL HIGH (ref 0.57–1.00)
GFR calc Af Amer: 66 mL/min/1.73
GFR calc non Af Amer: 57 mL/min/1.73 — ABNORMAL LOW
Globulin, Total: 2.9 g/dL (ref 1.5–4.5)
Glucose: 121 mg/dL — ABNORMAL HIGH (ref 65–99)
Potassium: 4.9 mmol/L (ref 3.5–5.2)
Sodium: 139 mmol/L (ref 134–144)
Total Protein: 7.8 g/dL (ref 6.0–8.5)

## 2017-12-27 LAB — MICROALBUMIN / CREATININE URINE RATIO
Creatinine, Urine: 63.8 mg/dL
Microalb/Creat Ratio: 9.1 mg/g{creat} (ref 0.0–30.0)
Microalbumin, Urine: 5.8 ug/mL

## 2017-12-27 LAB — VITAMIN B12: Vitamin B-12: 593 pg/mL (ref 232–1245)

## 2018-01-01 ENCOUNTER — Telehealth: Payer: Self-pay

## 2018-01-01 NOTE — Telephone Encounter (Signed)
Call is regarding Barbara Fields's 3rd health coaching session with [employer].

## 2018-01-08 ENCOUNTER — Telehealth: Payer: Self-pay | Admitting: *Deleted

## 2018-01-08 NOTE — Telephone Encounter (Signed)
-----   Message from Cain Saupeammie Fulp, MD sent at 12/27/2017  8:44 AM EST ----- Urine microalbumin level is pending. Patient with glucose of 121 and a mild increase in Cr at 1.03 (normal 0.57-1.00) and a mild increase in ALT at 55 (normal 0-32). Will repeat lab at patient's next visit. Vitamin B12 level is normal

## 2018-01-08 NOTE — Telephone Encounter (Signed)
Left message on voicemail to return call.

## 2018-01-10 ENCOUNTER — Telehealth: Payer: Self-pay | Admitting: Family Medicine

## 2018-01-10 NOTE — Telephone Encounter (Signed)
Dr. Jillyn HiddenFulp, does this patient need Fortamet or can we re-send for Metformin Er 500mg  2 tabs BID?

## 2018-01-10 NOTE — Telephone Encounter (Signed)
Please contact patient and see if she is agreeable to the metformin ER

## 2018-01-10 NOTE — Telephone Encounter (Signed)
Pt called [facility] [phone] stating they do not cover METFORMIN. Pt request we contact [employer] to get it approved.

## 2018-01-11 NOTE — Telephone Encounter (Signed)
Left VM asking for call back to discuss switch to Metformin ER 500mg  (2tablets twice daily)

## 2018-01-12 ENCOUNTER — Telehealth (INDEPENDENT_AMBULATORY_CARE_PROVIDER_SITE_OTHER): Payer: Self-pay

## 2018-01-12 NOTE — Telephone Encounter (Signed)
Ms. Barbara Fields is ok with a change to the metformin that is covered by her insurance. She also asked for clarification on the vm that was left for her about her labs, I re-read the results to her and let her know what the tests were for, she felt she understood what she was questioning in the vm.   The patient also mentioned she has been paying out of pocket for her zolpidem, I will create a PA for this and let her know of the outcome. I let her know it is common for medicare to require this on any med that can increase fall risks. She has my number to call back with further questions or concerns.

## 2018-01-12 NOTE — Telephone Encounter (Signed)
-----   Message from Cammie Fulp, MD sent at 12/27/2017  8:44 AM EST ----- Urine microalbumin level is pending. Patient with glucose of 121 and a mild increase in Cr at 1.03 (normal 0.57-1.00) and a mild increase in ALT at 55 (normal 0-32). Will repeat lab at patient's next visit. Vitamin B12 level is normal 

## 2018-01-12 NOTE — Telephone Encounter (Signed)
Left voicemail notifying patient of results per Dr. Jillyn HiddenFulp. Maryjean Mornempestt S Roberts, CMA

## 2018-01-15 ENCOUNTER — Other Ambulatory Visit: Payer: Self-pay | Admitting: Family Medicine

## 2018-01-15 DIAGNOSIS — E1140 Type 2 diabetes mellitus with diabetic neuropathy, unspecified: Secondary | ICD-10-CM

## 2018-01-15 MED ORDER — METFORMIN HCL ER 500 MG PO TB24: ORAL_TABLET | ORAL | 4 refills | Status: DC

## 2018-01-15 NOTE — Progress Notes (Signed)
Patient ID: Barbara Fields, female   DOB: 11-Jun-1950, 67 y.o.   MRN: [id number]   See recent phone encounter.  Patient called regarding medications.  Patient is agreeable to having metformin 500 mg ER, 2 pills twice daily called in as similar medication, Fortamet was not available.  Medication will be sent into patient's pharmacy.

## 2018-01-17 NOTE — Telephone Encounter (Signed)
Patient does not require a new RX for Zolpidem, just the new Metformin RX. She just happened to mention the need for a PA on her existing prescription.

## 2018-01-17 NOTE — Telephone Encounter (Signed)
Does a new RX needed to be printed or prescribed electronically for her Ambien 5 mg #15 per month? If sent electronically then to which pharmacy? Patient can receive up to 5 refills

## 2018-01-17 NOTE — Telephone Encounter (Signed)
I sent in a RX yesterday for the metformin ER

## 2018-01-17 NOTE — Telephone Encounter (Signed)
The PA for the patients Zolpidem 5mg  was approved through 01/31/2019.

## 2018-02-02 ENCOUNTER — Telehealth: Payer: Self-pay | Admitting: Family Medicine

## 2018-02-02 NOTE — Telephone Encounter (Signed)
Pt called to request an update on her psychology referral. Please follow up

## 2018-02-05 ENCOUNTER — Telehealth: Payer: Self-pay | Admitting: Family Medicine

## 2018-02-05 NOTE — Telephone Encounter (Signed)
Pt called to request an update on a referral to see a physiatrist on 12/26/2017, no referral was found. Please follow up

## 2018-02-06 ENCOUNTER — Other Ambulatory Visit: Payer: Self-pay

## 2018-02-06 DIAGNOSIS — R0989 Other specified symptoms and signs involving the circulatory and respiratory systems: Secondary | ICD-10-CM

## 2018-02-06 DIAGNOSIS — I739 Peripheral vascular disease, unspecified: Secondary | ICD-10-CM

## 2018-02-06 NOTE — Telephone Encounter (Signed)
I'm unable to  See the Behavior Referral it goes internal . I lvm to patient with Behavior health #

## 2018-02-08 ENCOUNTER — Other Ambulatory Visit: Payer: Self-pay | Admitting: Family Medicine

## 2018-02-08 NOTE — Telephone Encounter (Signed)
1) Medication(s) Requested (by name): Zolpidem  *pharmacy says they dont have an extra refill for the medication and need the prescription resent.  2) Pharmacy of Choice:  [phone] [place], [place] - [address]

## 2018-02-08 NOTE — Telephone Encounter (Signed)
Pt called to request an update on a referral to see a phychiatrist please follow up.  She was also interested in getting family counseling and is concerned if her insurance would cover it and if a referral was needed and if any resources could be provided. Please follow up.

## 2018-02-12 NOTE — Telephone Encounter (Signed)
Patient verified DOB Patient did receive the information for [facility] psychiatry referral.  Patient is also aware of ambien having 3 refills from 12/26/17. MA will contact pharmacy to clarify. No further questions. MA spoke with pharmacy and patient has medication to last until 03/05/2018. Patient filled an old august prescription, picked up 12/26/17 script and had another script written on 01/29/18. Patient will need to follow up with [facility] provider for additional refills.

## 2018-02-12 NOTE — Telephone Encounter (Signed)
1) Medication(s) Requested (by name): °Zolpidem ° °*pharmacy says they dont have an extra refill for the medication and need the prescription resent.  °2) Pharmacy of Choice: ° °[id number] [place], [place] - [address] °

## 2018-02-12 NOTE — Telephone Encounter (Signed)
Called and left vm for this patient.  She filled the prescription for Zolpidem 5mg  #15/15d from Dr. Jillyn Hidden on 12/26/17 then filled Zolpidem 10mg  #30/30 written by Dr. Concepcion Elk on 01/04/18   She picked up and RX for Zolpidem 10mg  #30/30 written by Dr. Concepcion Elk on 01/29/18, she should have enough medication to last until 03/05/18

## 2018-02-13 ENCOUNTER — Telehealth: Payer: Self-pay

## 2018-02-13 NOTE — Telephone Encounter (Signed)
Incoming call was transferred to me from [place] at the front desk.  Ms. Barbara Fields was upset and confused about her refills on her zolpidem, despite having spoken with [place] yesterday. I explained to her that since she had refilled the zolpidem that was written by another provider for a higher dose it invalidated the RX written by Dr. Jillyn Hidden. The patient then said that she understood completely now and she "had no idea this would be such a problem." I explained to her Dr. Debroah Baller reasoning for lowering her dose and having her use only prn due to her age and the [facility] [place] Criteria. She states she has been on Barbara Fields for many years and she is fine, she also reported that sometimes she has to take one and a half tablets(15mg ) to sleep. I told her that dosing is not recommended and she stated that her other doctors didn't care and she will be looking for a new PCP.

## 2018-02-14 ENCOUNTER — Ambulatory Visit: Payer: Medicare HMO | Attending: Family Medicine | Admitting: Physician Assistant

## 2018-02-14 VITALS — BP 172/81 | HR 63 | Temp 97.9°F | Resp 16 | Ht <= 58 in | Wt 179.6 lb

## 2018-02-14 DIAGNOSIS — E1140 Type 2 diabetes mellitus with diabetic neuropathy, unspecified: Secondary | ICD-10-CM

## 2018-02-14 DIAGNOSIS — H65191 Other acute nonsuppurative otitis media, right ear: Secondary | ICD-10-CM | POA: Diagnosis not present

## 2018-02-14 DIAGNOSIS — H00011 Hordeolum externum right upper eyelid: Secondary | ICD-10-CM

## 2018-02-14 DIAGNOSIS — I10 Essential (primary) hypertension: Secondary | ICD-10-CM | POA: Diagnosis not present

## 2018-02-14 LAB — POCT GLYCOSYLATED HEMOGLOBIN (HGB A1C): HbA1c, POC (controlled diabetic range): 7.4 % — AB (ref 0.0–7.0)

## 2018-02-14 LAB — GLUCOSE, POCT (MANUAL RESULT ENTRY): POC Glucose: 107 mg/dl — AB (ref 70–99)

## 2018-02-14 MED ORDER — LISINOPRIL-HYDROCHLOROTHIAZIDE 20-12.5 MG PO TABS: 1.0000 | ORAL_TABLET | Freq: Every day | ORAL | 4 refills | Status: AC

## 2018-02-14 MED ORDER — FLUTICASONE PROPIONATE 50 MCG/ACT NA SUSP: 2.0000 | Freq: Every day | NASAL | 3 refills | Status: AC

## 2018-02-14 MED ORDER — CEPHALEXIN 500 MG PO CAPS: 500.0000 mg | ORAL_CAPSULE | Freq: Three times a day (TID) | ORAL | 0 refills | Status: AC

## 2018-02-14 MED ORDER — FLUCONAZOLE 150 MG PO TABS: 150.0000 mg | ORAL_TABLET | Freq: Once | ORAL | 0 refills | Status: AC

## 2018-02-14 MED ORDER — METFORMIN HCL ER 500 MG PO TB24: ORAL_TABLET | ORAL | 4 refills | Status: AC

## 2018-02-14 MED FILL — CEPHALEXIN 500 MG CAPSULE: 500 | 10 days supply | Qty: 30 | Fill #0

## 2018-02-14 MED FILL — FLUCONAZOLE 150 MG TABS: 150 | 1 days supply | Qty: 1 | Fill #0

## 2018-02-14 MED FILL — FLUTICASONE PROP 50 MCG SPR: 50 | 30 days supply | Qty: 16 | Fill #0

## 2018-02-14 MED FILL — METFORMIN HCL ER 500 MG TAB: 500 | 30 days supply | Qty: 120 | Fill #0

## 2018-02-14 MED FILL — LISINOPRIL-HCTZ 20-12.5 MG: 20-12.5 | 30 days supply | Qty: 30 | Fill #0

## 2018-02-14 NOTE — Telephone Encounter (Signed)
Thanks for making me aware. This patient was also on another controlled substance and was complaining that since I would not fill that medication she would still have to go to the other doctor every 2-3 months and pay $100 and she thought that one doctor would do both-the higher dose of Zolpidem and her Klonopin I believe.  I "caved in " with another patient and her daughter and went ahead and gave her the higher Zolpidem because they simply refused to understand why the mother couldn't get the higher dose of Zolpidem since her last doctor gave it to her. They made me very uncomfortable.

## 2018-02-14 NOTE — Progress Notes (Signed)
Patient ID: Barbara Fields, female   DOB: 1950/09/24, 68 y.o.   MRN: [id number]   Aliviana Burdell, is a 68 y.o. female  GXQ:[id number]  [id number]:[id number]  DOB - 1950/06/11  Subjective:  Chief Complaint and HPI: Delvina Mizzell is a 68 y.o. female here today with several day h/o R eye upper lid TTP.  +R ear pain.  No fever.  No cough.    Denies hyper/hypoglycemia.  Denies HA/SOB/CP.  Didn't take BP meds today.   ROS:   Constitutional:  No f/c, No night sweats, No unexplained weight loss. EENT:  No vision changes, No blurry vision, No hearing changes. No other mouth, throat, or ear problems.  Respiratory: No cough, No SOB Cardiac: No CP, no palpitations GI:  No abd pain, No N/V/D. GU: No Urinary s/sx Musculoskeletal: No joint pain Neuro: No headache, no dizziness, no motor weakness.  Skin: No rash Endocrine:  No polydipsia. No polyuria.  Psych: Denies SI/HI  No problems updated.  ALLERGIES: Allergies  Allergen Reactions  . Peanut Oil Rash  . Shellfish Allergy Hives  . Vancomycin Hives    PAST MEDICAL HISTORY: Past Medical History:  Diagnosis Date  . Diabetes mellitus without complication ([place])   . Hyperlipemia   . Hypertension     MEDICATIONS AT HOME: Prior to Admission medications   Medication Sig Start Date End Date Taking? Authorizing Provider  APPLE CIDER VINEGAR PO Take 480 mg by mouth daily.    [provider]  aspirin 81 MG chewable tablet Chew 1 tablet by mouth daily. 01/10/13   [provider]  Blood Glucose Monitoring Suppl (ACCU-CHEK AVIVA PLUS) w/Device KIT 1 kit by Other route as directed. 03/12/12   [provider]  cephALEXin (KEFLEX) 500 MG capsule Take 1 capsule (500 mg total) by mouth 3 (three) times daily. 02/14/18   Argentina Donovan, PA-C  CINNAMON PO Take 1,000 mg by mouth daily.    [provider]  clonazePAM (KLONOPIN) 0.5 MG tablet Take 1 tablet by mouth as needed. 12/21/17   [provider]  fluconazole  (DIFLUCAN) 150 MG tablet Take 1 tablet (150 mg total) by mouth once for 1 dose. If needed for yeast infection after antibiotics 02/14/18 02/14/18  Argentina Donovan, PA-C  fluticasone [facility]) 50 MCG/ACT nasal spray Place 2 sprays into both nostrils daily. 02/14/18   Argentina Donovan, PA-C  lisinopril-hydrochlorothiazide (PRINZIDE,ZESTORETIC) 20-12.5 MG tablet Take 1 tablet by mouth daily. 02/14/18   Argentina Donovan, PA-C  metFORMIN (GLUCOPHAGE-XR) 500 MG 24 hr tablet Take 2 pills twice daily 02/14/18   Argentina Donovan, PA-C  Multiple Vitamins-Minerals (ONE-A-DAY 50 PLUS PO) Take 1 tablet by mouth daily.    [provider]  simvastatin (ZOCOR) 40 MG tablet Take 1 tablet (40 mg total) by mouth at bedtime. To lower cholesterol 12/26/17   Fulp, Cammie, MD  TRINTELLIX 10 MG TABS tablet Take 1 tablet by mouth daily. 12/25/17   [provider]  vitamin B-12 (CYANOCOBALAMIN) 50 MCG tablet Take 1 tablet by mouth as needed.    [provider]  zolpidem (AMBIEN) 5 MG tablet Take 1 tablet (5 mg total) by mouth at bedtime as needed for sleep. 12/26/17   Fulp, Cammie, MD     Objective:  EXAM:   Vitals:   02/14/18 1628  BP: (!) 172/81  Pulse: 63  Resp: 16  Temp: 97.9 F (36.6 C)  TempSrc: Oral  SpO2: 97%  Weight: 179 lb 9.6 oz (81.5 kg)  Height:  4' 9"  (1.448 m)    General appearance : A&OX3. NAD. Non-toxic-appearing HEENT: Atraumatic and Normocephalic.  PERRLA. EOM intact. R upper lid with small hordeolum. Conjunctiva B normal.  TM L WNL, R TM with mild erythema and slightly bulging.. Mouth-MMM, post pharynx WNL w/o erythema, No PND. Neck: supple, no JVD. No cervical lymphadenopathy. No thyromegaly Chest/Lungs:  Breathing-non-labored, Good air entry bilaterally, breath sounds normal without rales, rhonchi, or wheezing  CVS: S1 S2 regular, no murmurs, gallops, rubs  Extremities: Bilateral Lower Ext shows no edema, both legs are warm to touch with = pulse  throughout Neurology:  CN II-XII grossly intact, Non focal.   Psych:  TP linear. J/I WNL. Normal speech. Appropriate eye contact and affect.  Skin:  No Rash  Data Review Lab Results  Component Value Date   HGBA1C 7.4 (A) 02/14/2018   HGBA1C 8.4 (H) 10/11/2017     Assessment & Plan   1. Other non-recurrent acute nonsuppurative otitis media of right ear - fluticasone (FLONASE) 50 MCG/ACT nasal spray; Place 2 sprays into both nostrils daily.  Dispense: 16 g; Refill: 3 - cephALEXin (KEFLEX) 500 MG capsule; Take 1 capsule (500 mg total) by mouth 3 (three) times daily.  Dispense: 30 capsule; Refill: 0 - fluconazole (DIFLUCAN) 150 MG tablet; Take 1 tablet (150 mg total) by mouth once for 1 dose. If needed for yeast infection after antibiotics  Dispense: 1 tablet; Refill: 0  2. Type 2 diabetes mellitus with diabetic neuropathy, without long-term current use of insulin (HCC) Uncontrolled-work on diet and exercise - Glucose (CBG) - HgB A1c - metFORMIN (GLUCOPHAGE-XR) 500 MG 24 hr tablet; Take 2 pills twice daily  Dispense: 120 tablet; Refill: 4  3. Essential hypertension Uncontrolled-take meds as directed!! - lisinopril-hydrochlorothiazide (PRINZIDE,ZESTORETIC) 20-12.5 MG tablet; Take 1 tablet by mouth daily.  Dispense: 30 tablet; Refill: 4  4. Hordeolum externum of right upper eyelid - cephALEXin (KEFLEX) 500 MG capsule; Take 1 capsule (500 mg total) by mouth 3 (three) times daily.  Dispense: 30 capsule; Refill: 0   Patient have been counseled extensively about nutrition and exercise  Return for for March appt with Dr Chapman Fitch.  The patient was given clear instructions to go to ER or return to medical center if symptoms don't improve, worsen or new problems develop. The patient verbalized understanding. The patient was told to call to get lab results if they haven't heard anything in the next week.     Freeman Caldron, PA-C [facility] and [place],  [place]   02/14/2018, 4:38 PM

## 2018-03-09 ENCOUNTER — Encounter: Payer: Medicare HMO | Admitting: Vascular Surgery

## 2018-03-09 ENCOUNTER — Encounter (HOSPITAL_COMMUNITY): Payer: Medicare HMO

## 2018-04-04 ENCOUNTER — Ambulatory Visit: Payer: Medicare HMO | Admitting: Family Medicine

## 2018-04-05 ENCOUNTER — Ambulatory Visit: Payer: Medicare HMO | Admitting: Family Medicine

## 2018-04-06 ENCOUNTER — Encounter (HOSPITAL_COMMUNITY): Payer: Medicare HMO

## 2018-04-06 ENCOUNTER — Encounter: Payer: Medicare HMO | Admitting: Vascular Surgery

## 2018-06-06 ENCOUNTER — Other Ambulatory Visit (HOSPITAL_COMMUNITY)
Admission: RE | Admit: 2018-06-06 | Discharge: 2018-06-06 | Disposition: A | Discharge status: Home / Self Care | Payer: Medicare Other | Source: Ambulatory Visit | Attending: Family Medicine | Admitting: Family Medicine

## 2018-06-06 ENCOUNTER — Other Ambulatory Visit: Payer: Self-pay | Admitting: Family Medicine

## 2018-06-06 DIAGNOSIS — N898 Other specified noninflammatory disorders of vagina: Secondary | ICD-10-CM | POA: Insufficient documentation

## 2018-06-09 LAB — URINE CYTOLOGY ANCILLARY ONLY
Candida vaginitis: NEGATIVE
Chlamydia: NEGATIVE
Neisseria Gonorrhea: NEGATIVE
Trichomonas: NEGATIVE

## 2018-08-01 ENCOUNTER — Other Ambulatory Visit: Payer: Self-pay | Admitting: Family Medicine

## 2018-08-01 DIAGNOSIS — Z1231 Encounter for screening mammogram for malignant neoplasm of breast: Secondary | ICD-10-CM

## 2018-09-10 ENCOUNTER — Ambulatory Visit
Admission: RE | Admit: 2018-09-10 | Discharge: 2018-09-10 | Disposition: A | Discharge status: Home / Self Care | Payer: Medicare Other | Source: Ambulatory Visit | Attending: Family Medicine | Admitting: Family Medicine

## 2018-09-10 ENCOUNTER — Other Ambulatory Visit: Payer: Self-pay

## 2018-09-10 DIAGNOSIS — Z1231 Encounter for screening mammogram for malignant neoplasm of breast: Secondary | ICD-10-CM

## 2018-11-23 ENCOUNTER — Other Ambulatory Visit: Payer: Self-pay

## 2018-11-23 DIAGNOSIS — Z20822 Contact with and (suspected) exposure to covid-19: Secondary | ICD-10-CM

## 2018-11-24 LAB — NOVEL CORONAVIRUS, NAA: SARS-CoV-2, NAA: NOT DETECTED

## 2019-08-06 ENCOUNTER — Other Ambulatory Visit: Payer: Self-pay | Admitting: Family Medicine

## 2019-08-06 DIAGNOSIS — Z1231 Encounter for screening mammogram for malignant neoplasm of breast: Secondary | ICD-10-CM

## 2019-09-11 ENCOUNTER — Ambulatory Visit: Payer: Medicare Other

## 2019-11-24 IMAGING — MG DIGITAL DIAGNOSTIC BILATERAL MAMMOGRAM WITH TOMO AND CAD
6 of 12 series · 6 of 36 positions shown · non-contrast
Comparison: Previous exam(s).

CLINICAL DATA: Palpable lump I in the right axilla.

EXAM:
DIGITAL DIAGNOSTIC BILATERAL MAMMOGRAM WITH CAD AND TOMO
ULTRASOUND RIGHT BREAST

[R CC synth-2D (1 of 2)]
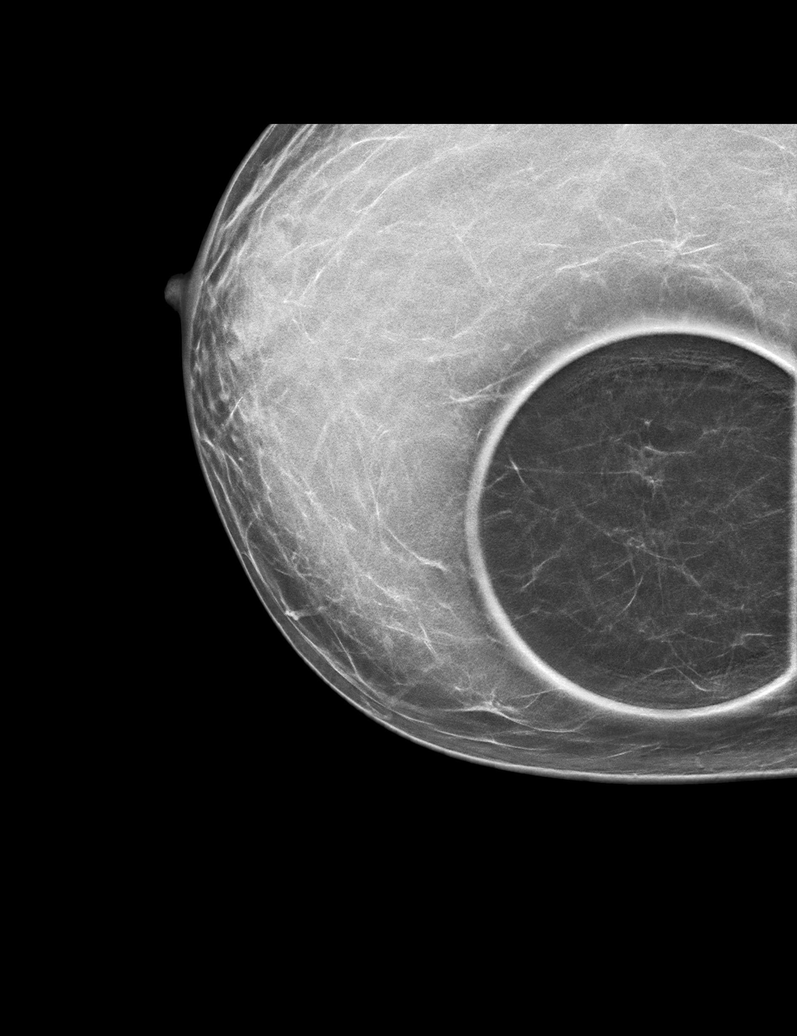

[L CC synth-2D]
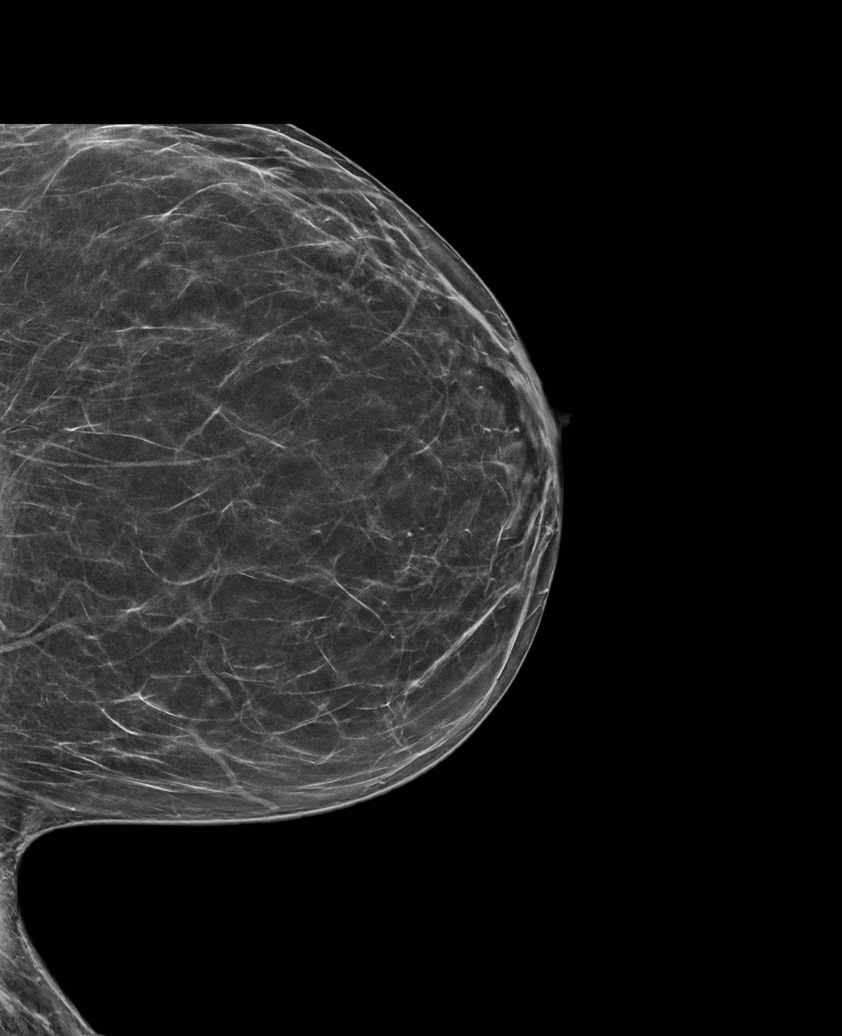

[R CC synth-2D (2 of 2)]
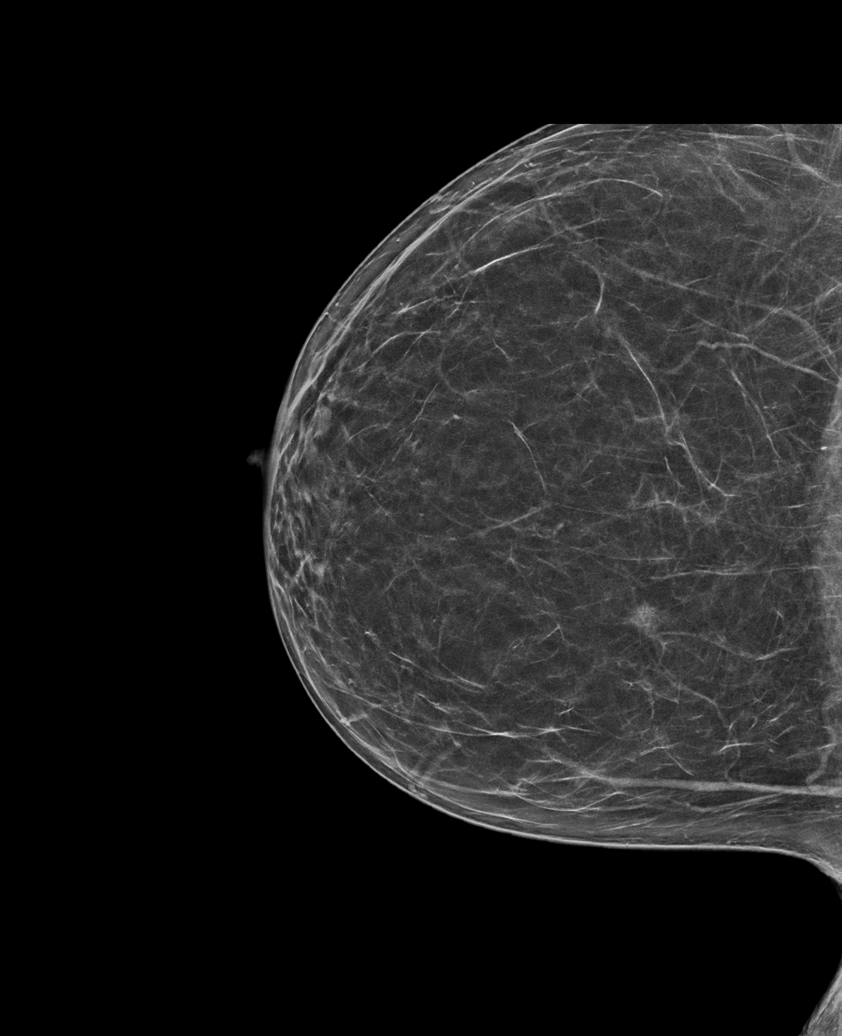

[R MLO synth-2D (1 of 2)]
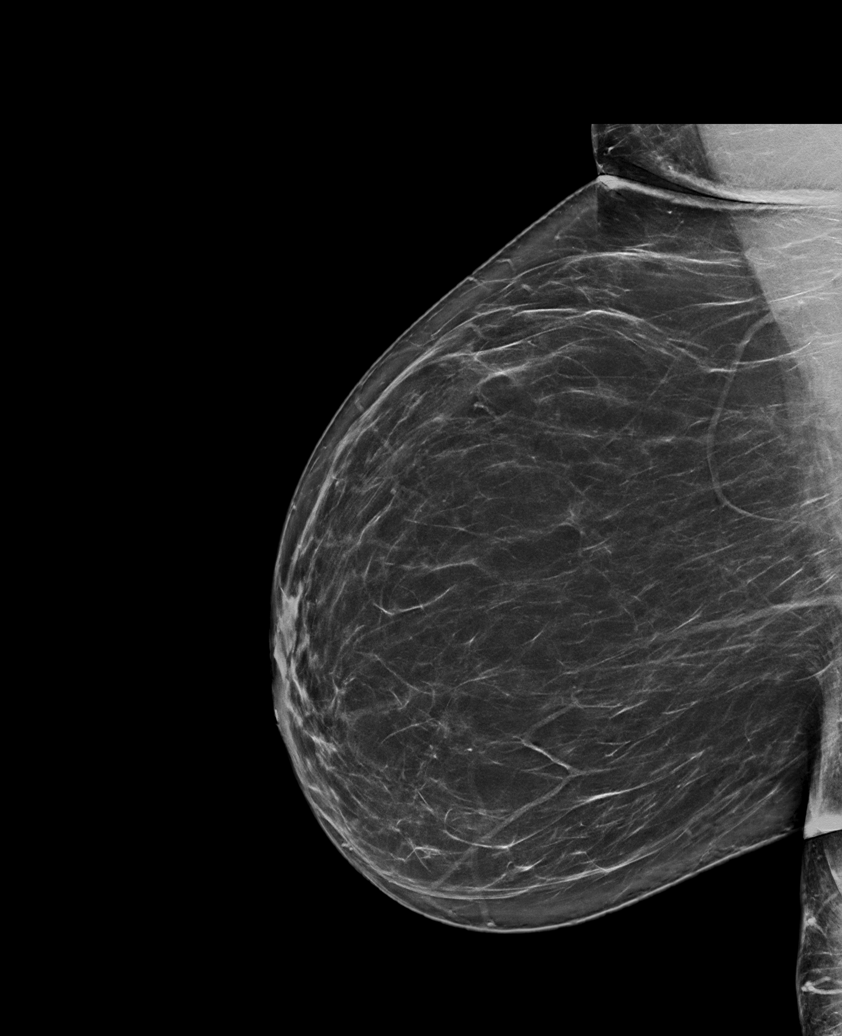

[R MLO synth-2D (2 of 2)]
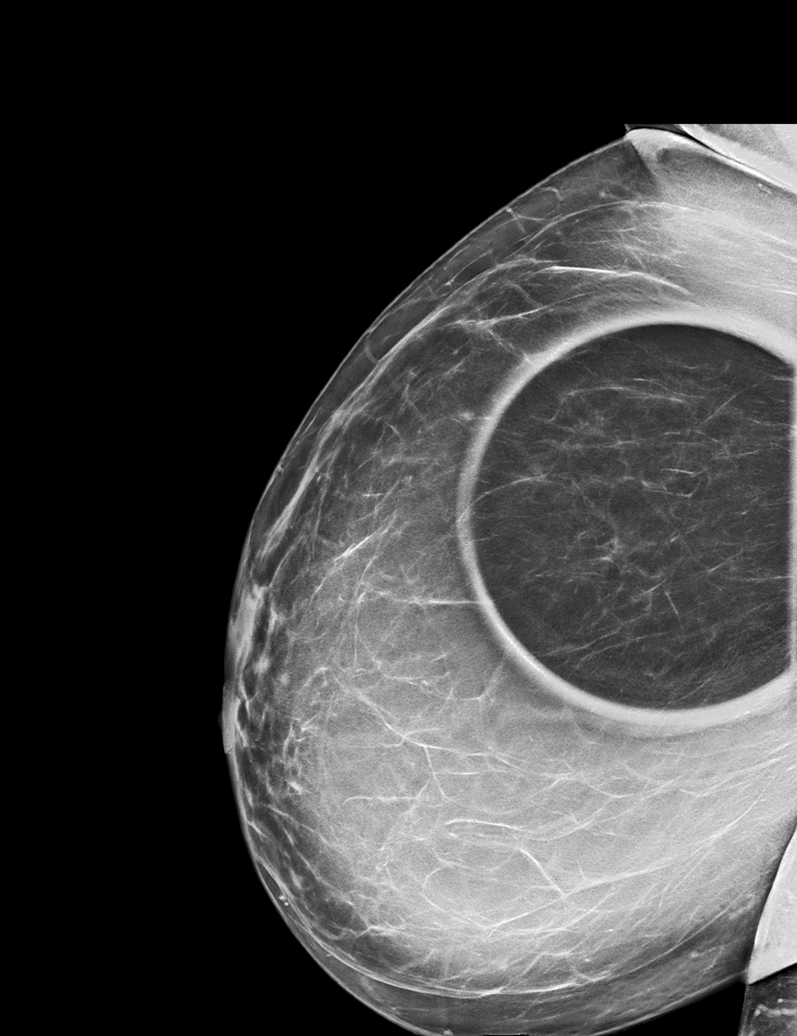

[L MLO synth-2D]
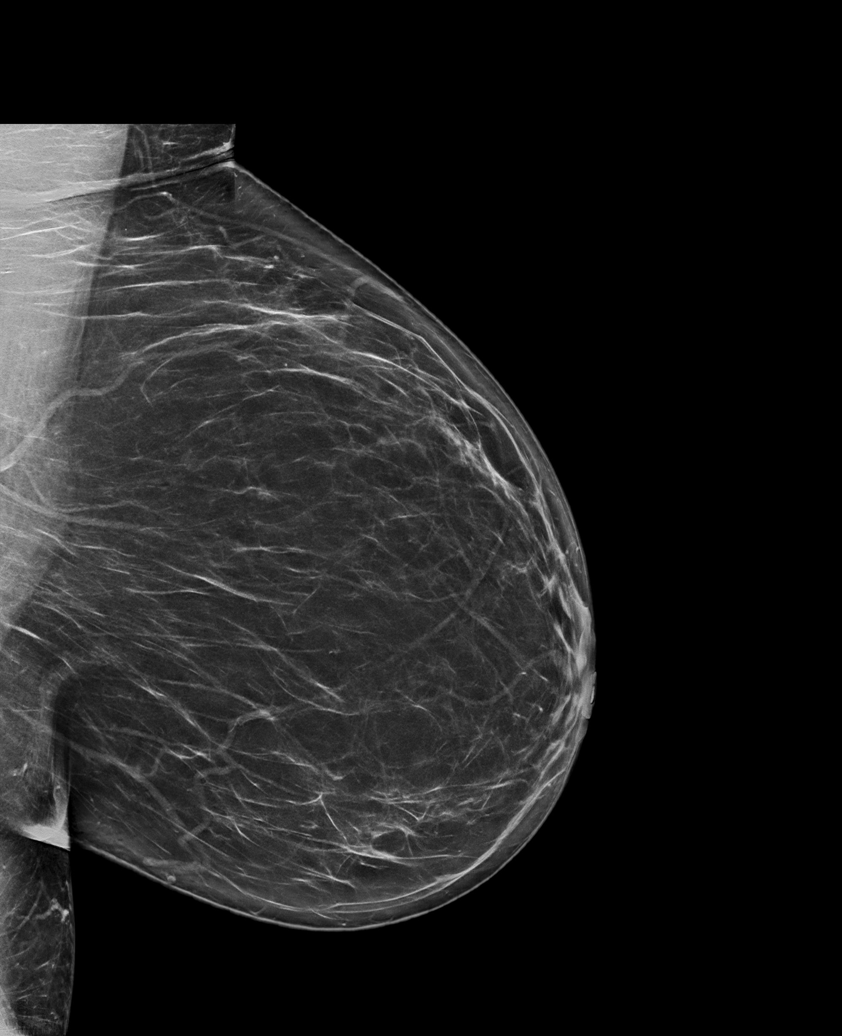

[6 of 36 positions shown; findings below may reference images not displayed]

ACR Breast Density Category b: There are scattered areas of
fibroglandular density.
FINDINGS: There is a developing density in the medial superior right breast at
a mid depth. No other suspicious mammographic findings.

Mammographic images were processed with CAD.

On physical exam, no suspicious lumps are identified.

Targeted ultrasound is performed, showing an irregular mass at 1
o'clock, 8 cm from the nipple thought to correlate with the
mammographically identified mass measuring 7 by 3 x 3 mm. Several
adjacent cysts are identified. No axillary adenopathy on the right.
The patient is feeling a lipoma measuring 2.5 x 0.9 x 2.0 cm the
junction of the right axilla and arm.
IMPRESSION: Irregular mass in the right breast at 1 o'clock, 8 cm from the
nipple thought to correlate with the developing density seen
mammographically. Apparent lipoma in the region of the patient's
right axillary palpable lump.

RECOMMENDATION:
Recommend ultrasound-guided biopsy of the 1 o'clock right breast
mass. Recommend clip placement follow-up mammography to ensure the
mammographic and sonographic findings correlate.

I have discussed the findings and recommendations with the patient.
Results were also provided in writing at the conclusion of the
visit. If applicable, a reminder letter will be sent to the patient
regarding the next appointment.

BI-RADS CATEGORY  4: Suspicious.

## 2020-02-19 ENCOUNTER — Other Ambulatory Visit: Payer: Self-pay

## 2020-02-19 ENCOUNTER — Other Ambulatory Visit: Payer: Self-pay | Admitting: Student

## 2020-02-19 ENCOUNTER — Ambulatory Visit
Admission: RE | Admit: 2020-02-19 | Discharge: 2020-02-19 | Disposition: A | Discharge status: Home / Self Care | Payer: Medicare Other | Source: Ambulatory Visit | Attending: Student | Admitting: Student

## 2020-02-19 DIAGNOSIS — M79672 Pain in left foot: Secondary | ICD-10-CM

## 2020-02-19 DIAGNOSIS — M25511 Pain in right shoulder: Secondary | ICD-10-CM

## 2020-02-19 DIAGNOSIS — F17210 Nicotine dependence, cigarettes, uncomplicated: Secondary | ICD-10-CM

## 2020-02-27 ENCOUNTER — Other Ambulatory Visit: Payer: Self-pay | Admitting: Student

## 2020-02-27 ENCOUNTER — Other Ambulatory Visit (HOSPITAL_COMMUNITY): Payer: Self-pay | Admitting: Student

## 2020-02-27 DIAGNOSIS — I10 Essential (primary) hypertension: Secondary | ICD-10-CM

## 2020-02-27 DIAGNOSIS — F17210 Nicotine dependence, cigarettes, uncomplicated: Secondary | ICD-10-CM

## 2020-03-13 ENCOUNTER — Ambulatory Visit
Admission: RE | Admit: 2020-03-13 | Discharge: 2020-03-13 | Disposition: A | Discharge status: Home / Self Care | Payer: Medicare Other | Source: Ambulatory Visit | Attending: Student | Admitting: Student

## 2020-03-13 DIAGNOSIS — F17210 Nicotine dependence, cigarettes, uncomplicated: Secondary | ICD-10-CM

## 2020-03-16 ENCOUNTER — Other Ambulatory Visit: Payer: Self-pay | Admitting: Student

## 2020-03-16 DIAGNOSIS — E041 Nontoxic single thyroid nodule: Secondary | ICD-10-CM

## 2020-03-18 ENCOUNTER — Ambulatory Visit (HOSPITAL_COMMUNITY): Payer: Medicare Other | Attending: Cardiology

## 2020-03-18 ENCOUNTER — Other Ambulatory Visit: Payer: Self-pay

## 2020-03-18 DIAGNOSIS — I10 Essential (primary) hypertension: Secondary | ICD-10-CM | POA: Diagnosis present

## 2020-03-18 LAB — ECHOCARDIOGRAM COMPLETE
Area-P 1/2: 3.83 cm2
S' Lateral: 1.9 cm

## 2020-04-07 ENCOUNTER — Ambulatory Visit
Admission: RE | Admit: 2020-04-07 | Discharge: 2020-04-07 | Disposition: A | Discharge status: Home / Self Care | Payer: Medicare Other | Source: Ambulatory Visit | Attending: Student | Admitting: Student

## 2020-04-07 DIAGNOSIS — E041 Nontoxic single thyroid nodule: Secondary | ICD-10-CM

## 2020-04-10 ENCOUNTER — Other Ambulatory Visit: Payer: Self-pay | Admitting: Student

## 2020-04-10 DIAGNOSIS — E041 Nontoxic single thyroid nodule: Secondary | ICD-10-CM

## 2020-06-16 ENCOUNTER — Other Ambulatory Visit: Payer: Self-pay | Admitting: Student

## 2020-06-26 ENCOUNTER — Other Ambulatory Visit: Payer: Self-pay | Admitting: Family Medicine

## 2020-06-26 ENCOUNTER — Other Ambulatory Visit: Payer: Self-pay | Admitting: Student

## 2020-06-26 ENCOUNTER — Other Ambulatory Visit: Payer: Self-pay | Admitting: Physician Assistant

## 2020-06-30 ENCOUNTER — Other Ambulatory Visit: Payer: Self-pay | Admitting: Student

## 2020-06-30 DIAGNOSIS — M79605 Pain in left leg: Secondary | ICD-10-CM

## 2020-07-01 ENCOUNTER — Ambulatory Visit
Admission: RE | Admit: 2020-07-01 | Discharge: 2020-07-01 | Disposition: A | Discharge status: Home / Self Care | Payer: Medicare Other | Source: Ambulatory Visit | Attending: Student | Admitting: Student

## 2020-07-01 ENCOUNTER — Other Ambulatory Visit (HOSPITAL_COMMUNITY)
Admission: RE | Admit: 2020-07-01 | Discharge: 2020-07-01 | Disposition: A | Discharge status: Home / Self Care | Payer: Medicare Other | Source: Ambulatory Visit | Attending: Student | Admitting: Student

## 2020-07-01 DIAGNOSIS — E041 Nontoxic single thyroid nodule: Secondary | ICD-10-CM | POA: Insufficient documentation

## 2020-07-02 ENCOUNTER — Ambulatory Visit
Admission: RE | Admit: 2020-07-02 | Discharge: 2020-07-02 | Disposition: A | Discharge status: Home / Self Care | Payer: Medicare Other | Source: Ambulatory Visit | Attending: Student | Admitting: Student

## 2020-07-02 DIAGNOSIS — M79605 Pain in left leg: Secondary | ICD-10-CM

## 2020-07-02 LAB — CYTOLOGY - NON PAP

## 2020-08-20 ENCOUNTER — Other Ambulatory Visit: Payer: Self-pay | Admitting: Student

## 2020-08-20 DIAGNOSIS — M5432 Sciatica, left side: Secondary | ICD-10-CM

## 2020-08-20 DIAGNOSIS — M5431 Sciatica, right side: Secondary | ICD-10-CM

## 2020-09-04 ENCOUNTER — Ambulatory Visit
Admission: RE | Admit: 2020-09-04 | Discharge: 2020-09-04 | Disposition: A | Discharge status: Home / Self Care | Payer: Medicare Other | Source: Ambulatory Visit | Attending: Student | Admitting: Student

## 2020-09-04 DIAGNOSIS — M5432 Sciatica, left side: Secondary | ICD-10-CM

## 2020-09-04 DIAGNOSIS — M5431 Sciatica, right side: Secondary | ICD-10-CM

## 2021-10-09 ENCOUNTER — Emergency Department (HOSPITAL_COMMUNITY)
Admission: EM | Admit: 2021-10-09 | Discharge: 2021-10-09 | Disposition: A | Discharge status: Home / Self Care | Payer: Medicare Other | Attending: Emergency Medicine | Admitting: Emergency Medicine

## 2021-10-09 ENCOUNTER — Encounter (HOSPITAL_COMMUNITY): Payer: Self-pay | Admitting: Emergency Medicine

## 2021-10-09 ENCOUNTER — Emergency Department (HOSPITAL_COMMUNITY): Payer: Medicare Other

## 2021-10-09 DIAGNOSIS — M546 Pain in thoracic spine: Secondary | ICD-10-CM | POA: Diagnosis not present

## 2021-10-09 DIAGNOSIS — E119 Type 2 diabetes mellitus without complications: Secondary | ICD-10-CM | POA: Insufficient documentation

## 2021-10-09 DIAGNOSIS — Z7982 Long term (current) use of aspirin: Secondary | ICD-10-CM | POA: Insufficient documentation

## 2021-10-09 DIAGNOSIS — Z7984 Long term (current) use of oral hypoglycemic drugs: Secondary | ICD-10-CM | POA: Diagnosis not present

## 2021-10-09 DIAGNOSIS — Y92410 Unspecified street and highway as the place of occurrence of the external cause: Secondary | ICD-10-CM | POA: Diagnosis not present

## 2021-10-09 DIAGNOSIS — Z91010 Allergy to peanuts: Secondary | ICD-10-CM | POA: Insufficient documentation

## 2021-10-09 DIAGNOSIS — Z79899 Other long term (current) drug therapy: Secondary | ICD-10-CM | POA: Diagnosis not present

## 2021-10-09 DIAGNOSIS — I10 Essential (primary) hypertension: Secondary | ICD-10-CM | POA: Diagnosis not present

## 2021-10-09 DIAGNOSIS — M62838 Other muscle spasm: Secondary | ICD-10-CM | POA: Diagnosis not present

## 2021-10-09 MED ORDER — ACETAMINOPHEN 325 MG PO TABS
650.0000 mg | ORAL_TABLET | Freq: Once | ORAL | Status: AC
  Administered 2021-10-09: 650 mg via ORAL
  Filled 2021-10-09: qty 2

## 2021-10-09 MED ORDER — CYCLOBENZAPRINE HCL 5 MG PO TABS: 5.0000 mg | ORAL_TABLET | Freq: Two times a day (BID) | ORAL | 0 refills | Status: DC | PRN
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
## 2021-10-09 NOTE — Discharge Instructions (Signed)
X-ray is normal.  Overall suspect you have a muscle spasm.  I have prescribed you Flexeril which is a muscle relaxant.  Do not mix with alcohol or drugs or dangerous activities including driving as this medication is sedating.  Do not mix this with your Ambien if you are still taking that medicine.  Please follow-up with your primary care doctor.  Recommend 1000 mg of Tylenol every 6 hours as needed for pain as well.
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
## 2021-10-10 ENCOUNTER — Telehealth (HOSPITAL_COMMUNITY): Payer: Self-pay | Admitting: Emergency Medicine

## 2021-10-10 MED ORDER — CYCLOBENZAPRINE HCL 5 MG PO TABS: 5.0000 mg | ORAL_TABLET | Freq: Two times a day (BID) | ORAL | 0 refills | Status: AC | PRN

## 2021-10-10 NOTE — Telephone Encounter (Signed)
Patient lost her flexeril Rx from yesterday. A new one has been printed for her

## 2021-10-10 NOTE — ED Notes (Signed)
Opened chart at pts request to reprint discharge paper work and obtain a new prescription. Message has been left for pt to pick up at registration window.

## 2022-12-09 ENCOUNTER — Emergency Department (HOSPITAL_COMMUNITY)
Admission: EM | Admit: 2022-12-09 | Discharge: 2022-12-09 | Disposition: A | Discharge status: Home / Self Care | Payer: 59 | Attending: Emergency Medicine | Admitting: Emergency Medicine

## 2022-12-09 ENCOUNTER — Other Ambulatory Visit: Payer: Self-pay

## 2022-12-09 DIAGNOSIS — Z7984 Long term (current) use of oral hypoglycemic drugs: Secondary | ICD-10-CM | POA: Diagnosis not present

## 2022-12-09 DIAGNOSIS — R42 Dizziness and giddiness: Secondary | ICD-10-CM

## 2022-12-09 DIAGNOSIS — Z7982 Long term (current) use of aspirin: Secondary | ICD-10-CM | POA: Diagnosis not present

## 2022-12-09 DIAGNOSIS — E119 Type 2 diabetes mellitus without complications: Secondary | ICD-10-CM | POA: Insufficient documentation

## 2022-12-09 DIAGNOSIS — D75839 Thrombocytosis, unspecified: Secondary | ICD-10-CM | POA: Diagnosis not present

## 2022-12-09 DIAGNOSIS — Z79899 Other long term (current) drug therapy: Secondary | ICD-10-CM | POA: Insufficient documentation

## 2022-12-09 DIAGNOSIS — Z91010 Allergy to peanuts: Secondary | ICD-10-CM | POA: Insufficient documentation

## 2022-12-09 DIAGNOSIS — I10 Essential (primary) hypertension: Secondary | ICD-10-CM | POA: Insufficient documentation

## 2022-12-09 DIAGNOSIS — E860 Dehydration: Secondary | ICD-10-CM | POA: Diagnosis not present

## 2022-12-09 DIAGNOSIS — D75838 Other thrombocytosis: Secondary | ICD-10-CM

## 2022-12-09 LAB — CBC WITH DIFFERENTIAL/PLATELET
Abs Immature Granulocytes: 0.02 10*3/uL (ref 0.00–0.07)
Basophils Absolute: 0.1 10*3/uL (ref 0.0–0.1)
Basophils Relative: 1 %
Eosinophils Absolute: 0.2 10*3/uL (ref 0.0–0.5)
Eosinophils Relative: 3 %
HCT: 31.4 % — ABNORMAL LOW (ref 36.0–46.0)
Hemoglobin: 10 g/dL — ABNORMAL LOW (ref 12.0–15.0)
Immature Granulocytes: 0 %
Lymphocytes Relative: 37 %
Lymphs Abs: 2.5 10*3/uL (ref 0.7–4.0)
MCH: 26 pg (ref 26.0–34.0)
MCHC: 31.8 g/dL (ref 30.0–36.0)
MCV: 81.6 fL (ref 80.0–100.0)
Monocytes Absolute: 0.6 10*3/uL (ref 0.1–1.0)
Monocytes Relative: 9 %
Neutro Abs: 3.4 10*3/uL (ref 1.7–7.7)
Neutrophils Relative %: 50 %
Platelets: 875 10*3/uL — ABNORMAL HIGH (ref 150–400)
RBC: 3.85 MIL/uL — ABNORMAL LOW (ref 3.87–5.11)
RDW: 16 % — ABNORMAL HIGH (ref 11.5–15.5)
WBC: 6.8 10*3/uL (ref 4.0–10.5)
nRBC: 0 % (ref 0.0–0.2)

## 2022-12-09 LAB — BASIC METABOLIC PANEL
Anion gap: 12 (ref 5–15)
BUN: 18 mg/dL (ref 8–23)
CO2: 23 mmol/L (ref 22–32)
Calcium: 9.4 mg/dL (ref 8.9–10.3)
Chloride: 97 mmol/L — ABNORMAL LOW (ref 98–111)
Creatinine, Ser: 0.86 mg/dL (ref 0.44–1.00)
GFR, Estimated: >60 mL/min (ref >60)
Glucose, Bld: 95 mg/dL (ref 70–99)
Potassium: 4.1 mmol/L (ref 3.5–5.1)
Sodium: 132 mmol/L — ABNORMAL LOW (ref 135–145)

## 2022-12-09 MED ORDER — ONDANSETRON HCL 4 MG/2ML IJ SOLN
4.0000 mg | Freq: Once | INTRAMUSCULAR | Status: AC
  Administered 2022-12-09: 4 mg via INTRAVENOUS
  Filled 2022-12-09: qty 2

## 2022-12-09 MED ORDER — LACTATED RINGERS IV BOLUS
1000.0000 mL | Freq: Once | INTRAVENOUS | Status: AC
  Administered 2022-12-09: 1000 mL via INTRAVENOUS

## 2022-12-09 NOTE — ED Notes (Signed)
Previous nurse did not see the discharge. Patient is being discharged now.

## 2022-12-09 NOTE — ED Provider Notes (Signed)
[place] EMERGENCY DEPARTMENT AT [facility] Provider Note   CSN: [id number] Arrival date & time: 12/09/22  1358     History  Chief Complaint  Patient presents with   Dizziness   abnormal labs    Barbara Fields is a 72 y.o. female.   Dizziness    Patient has a history of hypertension diabetes hyperlipidemia.  Patient also has history of pancreatic mass and a serous cystadenoma.  She has had a pancreatic duct leak and postprocedural intra-abdominal abscess.  Patient recently has had surgeries and hospital admissions at another medical facility in September and October.  Patient had a follow-up visit in the office on November 4.  She had laboratory tests.  Patient states in the last couple days she has been feeling lightheaded.  She felt as if she might pass out.  She has had some nausea and decreased appetite.  She did have 1 episode of emesis not more than that.  She has not had any diarrhea.  No blood in her stool.  She denies any fevers or chills.  No chest pain no abdominal pain.  No shortness of breath.  Patient states with her feeling poorly she checked on the laboratory tests that she had on November 4.  Patient reviewed those labs and noticed that her hemoglobin was low and her platelet count was high.  She called her doctor and was instructed to come to the hospital to be evaluated.  Outpatient labs reviewed.  Patient was noted to have a hemoglobin of 11.6 and a platelet count greater than 900 on a CBC performed on November 4.  Previously hemoglobin was 9.4 and platelet was 605  Home Medications Prior to Admission medications   Medication Sig Start Date End Date Taking? Authorizing Provider  APPLE CIDER VINEGAR PO Take 480 mg by mouth daily.    [provider]  aspirin 81 MG chewable tablet Chew 1 tablet by mouth daily. 01/10/13   [provider]  Blood Glucose Monitoring Suppl (ACCU-CHEK AVIVA PLUS) w/Device KIT 1 kit by Other route as directed.  03/12/12   [provider]  cephALEXin (KEFLEX) 500 MG capsule Take 1 capsule (500 mg total) by mouth 3 (three) times daily. 02/14/18   Anders Simmonds, PA-C  CINNAMON PO Take 1,000 mg by mouth daily.    [provider]  clonazePAM (KLONOPIN) 0.5 MG tablet Take 1 tablet by mouth as needed. 12/21/17   [provider]  cyclobenzaprine (FLEXERIL) 5 MG tablet Take 1 tablet (5 mg total) by mouth 2 (two) times daily as needed for up to 20 doses for muscle spasms. 10/10/21   Pricilla Loveless, MD  fluticasone (FLONASE) 50 MCG/ACT nasal spray Place 2 sprays into both nostrils daily. 02/14/18   Anders Simmonds, PA-C  lisinopril-hydrochlorothiazide (PRINZIDE,ZESTORETIC) 20-12.5 MG tablet Take 1 tablet by mouth daily. 02/14/18   Anders Simmonds, PA-C  metFORMIN (GLUCOPHAGE-XR) 500 MG 24 hr tablet Take 2 pills twice daily 02/14/18   Anders Simmonds, PA-C  Multiple Vitamins-Minerals (ONE-A-DAY 50 PLUS PO) Take 1 tablet by mouth daily.    [provider]  simvastatin (ZOCOR) 40 MG tablet Take 1 tablet (40 mg total) by mouth at bedtime. To lower cholesterol 12/26/17   Fulp, Cammie, MD  TRINTELLIX 10 MG TABS tablet Take 1 tablet by mouth daily. 12/25/17   [provider]  vitamin B-12 (CYANOCOBALAMIN) 50 MCG tablet Take 1 tablet by mouth as needed.    [provider]  zolpidem (AMBIEN) 5 MG tablet Take 1 tablet (5 mg total) by mouth at bedtime as needed for sleep. 12/26/17   Fulp, Cammie, MD      Allergies    Peanut oil, Shellfish allergy, and Vancomycin    Review of Systems   Review of Systems  Neurological:  Positive for dizziness.    Physical Exam Updated Vital Signs BP 111/69   Pulse 87   Temp 98.2 F (36.8 C)   Resp 17   Ht 1.448 m (4\' 9" )   Wt 59 kg   SpO2 96%   BMI 28.13 kg/m  Physical Exam Vitals and nursing note reviewed.  Constitutional:      Appearance: She is well-developed. She is not diaphoretic.  HENT:     Head:  Normocephalic and atraumatic.     Right Ear: External ear normal.     Left Ear: External ear normal.  Eyes:     General: No scleral icterus.       Right eye: No discharge.        Left eye: No discharge.     Conjunctiva/sclera: Conjunctivae normal.  Neck:     Trachea: No tracheal deviation.  Cardiovascular:     Rate and Rhythm: Normal rate and regular rhythm.  Pulmonary:     Effort: Pulmonary effort is normal. No respiratory distress.     Breath sounds: Normal breath sounds. No stridor. No wheezing or rales.  Abdominal:     General: Bowel sounds are normal. There is no distension.     Palpations: Abdomen is soft.     Tenderness: There is no abdominal tenderness. There is no guarding or rebound.     Comments: Abdominal drains in place  Musculoskeletal:        General: No tenderness or deformity.     Cervical back: Neck supple.     Right lower leg: No edema.     Left lower leg: No edema.  Skin:    General: Skin is warm and dry.     Findings: No rash.  Neurological:     General: No focal deficit present.     Mental Status: She is alert.     Cranial Nerves: No cranial nerve deficit, dysarthria or facial asymmetry.     Sensory: No sensory deficit.     Motor: No abnormal muscle tone or seizure activity.     Coordination: Coordination normal.  Psychiatric:        Mood and Affect: Mood normal.     ED Results / Procedures / Treatments   Labs (all labs ordered are listed, but only abnormal results are displayed) Labs Reviewed  CBC WITH DIFFERENTIAL/PLATELET - Abnormal; Notable for the following components:      Result Value   RBC 3.85 (*)    Hemoglobin 10.0 (*)    HCT 31.4 (*)    RDW 16.0 (*)    Platelets 875 (*)    All other components within normal limits  BASIC METABOLIC PANEL - Abnormal; Notable for the following components:   Sodium 132 (*)    Chloride 97 (*)    All other components within normal limits    EKG EKG Interpretation Date/Time:  Friday December 09 2022 16:21:36 EST Ventricular Rate:  73 PR Interval:  148 QRS Duration:  84 QT Interval:  389 QTC Calculation: 432 R Axis:   88  Text Interpretation: Sinus rhythm Borderline right axis deviation Confirmed by Linwood Dibbles [phone]) on 12/09/2022 4:29:10 PM  Radiology No  results found.  Procedures Procedures    Medications Ordered in ED Medications  lactated ringers bolus 1,000 mL (1,000 mLs Intravenous New Bag/Given 12/09/22 1549)  ondansetron (ZOFRAN) injection 4 mg (4 mg Intravenous Given 12/09/22 1550)    ED Course/ Medical Decision Making/ A&P Clinical Course as of 12/09/22 1843  Fri Dec 09, 2022  1817 Hemoglobin is 10.0.  Metabolic panel does not show signs of severe dehydration.  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 Decision-making details documented in ED Course.  Risk Prescription drug management.  Patient presented to the ED with complaints of dizziness and lightheadedness for the last 2 days.  Patient has had some issues with her appetite.  Patient also had noted that her labs were abnormal recently.  She had outpatient labs drawn from another medical facility.  Patient's labs today showed mild anemia but this is stable.  Doubt that this is acutely related to her dizziness.  Patient's labs have been elevated recently.  It was 641 and then her then 900.  Today platelets are 875.  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  No  focal neurologic symptoms to suggest stroke TIA.  Will have patient continue her aspirin daily and follow-up with her hematologist at [facility].  Suspect patient's symptoms likely related to dehydration.  She was noted to be mildly tachycardic recently.  She has had decreased p.o. intake.  Patient was given IV fluids and is feeling better.  Evaluation and diagnostic testing in the emergency department does not suggest an emergent condition requiring admission or immediate intervention beyond what has been performed at this time.  The patient is safe for discharge and has been instructed to return immediately for worsening symptoms, change in symptoms or any other concerns.        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

## 2022-12-09 NOTE — ED Triage Notes (Signed)
Pt has had off and on dizziness and lightheadedness for a few days. Also had labs done, platelets high and told to come to ed for eval. Low appetite. 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

## 2022-12-09 NOTE — Discharge Instructions (Addendum)
Drink plenty of fluids.  Follow-up with your doctor to be rechecked.  Return as needed for worsening symptoms

## 2023-07-04 ENCOUNTER — Ambulatory Visit: Admitting: Physical Therapy
